# Patient Record
Sex: Female | Born: 1935 | Race: White | Hispanic: No
Health system: Southern US, Community
[De-identification: ages and names within clinical notes are randomized; demographics above are authoritative.]

---

## 2005-01-17 ENCOUNTER — Emergency Department: Payer: Self-pay | Admitting: Emergency Medicine

## 2005-01-19 ENCOUNTER — Other Ambulatory Visit: Payer: Self-pay

## 2005-01-19 ENCOUNTER — Inpatient Hospital Stay: Payer: Self-pay | Admitting: Internal Medicine

## 2005-02-18 ENCOUNTER — Ambulatory Visit: Payer: Self-pay | Admitting: Internal Medicine

## 2005-02-28 ENCOUNTER — Ambulatory Visit: Payer: Self-pay | Admitting: Internal Medicine

## 2005-03-08 ENCOUNTER — Ambulatory Visit: Payer: Self-pay | Admitting: Internal Medicine

## 2005-04-03 ENCOUNTER — Ambulatory Visit: Payer: Self-pay | Admitting: Ophthalmology

## 2005-04-09 ENCOUNTER — Ambulatory Visit: Payer: Self-pay | Admitting: Ophthalmology

## 2005-08-16 ENCOUNTER — Ambulatory Visit: Payer: Self-pay | Admitting: Gastroenterology

## 2006-03-03 ENCOUNTER — Ambulatory Visit: Payer: Self-pay | Admitting: Internal Medicine
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
## 2006-11-25 ENCOUNTER — Ambulatory Visit: Payer: Self-pay | Admitting: Internal Medicine

## 2007-03-10 ENCOUNTER — Ambulatory Visit: Payer: Self-pay | Admitting: Internal Medicine

## 2007-12-16 ENCOUNTER — Ambulatory Visit: Payer: Self-pay | Admitting: Internal Medicine

## 2008-03-11 ENCOUNTER — Ambulatory Visit: Payer: Self-pay | Admitting: Internal Medicine

## 2008-06-15 ENCOUNTER — Ambulatory Visit: Payer: Self-pay | Admitting: Internal Medicine

## 2009-01-31 ENCOUNTER — Emergency Department: Payer: Self-pay | Admitting: Emergency Medicine

## 2009-03-13 ENCOUNTER — Ambulatory Visit: Payer: Self-pay | Admitting: Internal Medicine

## 2009-04-20 ENCOUNTER — Emergency Department: Payer: Self-pay | Admitting: Emergency Medicine

## 2009-11-06 ENCOUNTER — Ambulatory Visit: Payer: Self-pay | Admitting: Neurology

## 2010-08-22 ENCOUNTER — Ambulatory Visit: Payer: Self-pay | Admitting: Internal Medicine

## 2010-11-07 ENCOUNTER — Ambulatory Visit: Payer: Self-pay | Admitting: Internal Medicine

## 2011-08-11 ENCOUNTER — Inpatient Hospital Stay: Payer: Self-pay | Admitting: Internal Medicine

## 2011-08-27 ENCOUNTER — Inpatient Hospital Stay: Payer: Self-pay | Admitting: Internal Medicine

## 2011-08-27 ENCOUNTER — Ambulatory Visit: Payer: Self-pay | Admitting: Internal Medicine

## 2011-08-27 LAB — COMPREHENSIVE METABOLIC PANEL
Albumin: 2.6 g/dL — ABNORMAL LOW (ref 3.4–5.0)
Alkaline Phosphatase: 261 U/L — ABNORMAL HIGH (ref 50–136)
Anion Gap: 7 (ref 7–16)
BUN: 12 mg/dL (ref 7–18)
Bilirubin,Total: 0.3 mg/dL (ref 0.2–1.0)
Calcium, Total: 8.6 mg/dL (ref 8.5–10.1)
Creatinine: 0.36 mg/dL — ABNORMAL LOW (ref 0.60–1.30)
EGFR (African American): >60
EGFR (Non-African Amer.): >60
Glucose: 183 mg/dL — ABNORMAL HIGH (ref 65–99)
Osmolality: 284 (ref 275–301)
Potassium: 4.5 mmol/L (ref 3.5–5.1)
SGPT (ALT): 43 U/L
Sodium: 140 mmol/L (ref 136–145)
Total Protein: 6.4 g/dL (ref 6.4–8.2)

## 2011-08-27 LAB — CBC
HCT: 42.3 % (ref 35.0–47.0)
HGB: 14.1 g/dL (ref 12.0–16.0)
MCH: 32.5 pg (ref 26.0–34.0)
MCV: 98 fL (ref 80–100)
Platelet: 311 10*3/uL (ref 150–440)
RDW: 14.1 % (ref 11.5–14.5)

## 2011-08-27 LAB — CK TOTAL AND CKMB (NOT AT ARMC)
CK, Total: 47 U/L (ref 21–215)
CK-MB: 2.5 ng/mL (ref 0.5–3.6)

## 2011-08-27 LAB — TROPONIN I: Troponin-I: <0.02 ng/mL

## 2011-08-28 LAB — BASIC METABOLIC PANEL
Anion Gap: 8 (ref 7–16)
Chloride: 101 mmol/L (ref 98–107)
Co2: 30 mmol/L (ref 21–32)
Creatinine: 0.39 mg/dL — ABNORMAL LOW (ref 0.60–1.30)
EGFR (African American): >60
Glucose: 105 mg/dL — ABNORMAL HIGH (ref 65–99)
Osmolality: 278 (ref 275–301)

## 2011-08-28 LAB — CBC WITH DIFFERENTIAL/PLATELET
Basophil #: 0 10*3/uL (ref 0.0–0.1)
Basophil %: 0.2 %
Eosinophil #: 0 10*3/uL (ref 0.0–0.7)
Eosinophil %: 0 %
HCT: 38.1 % (ref 35.0–47.0)
HGB: 12.6 g/dL (ref 12.0–16.0)
Lymphocyte %: 5.7 %
MCV: 98 fL (ref 80–100)
Monocyte #: 0.9 10*3/uL — ABNORMAL HIGH (ref 0.0–0.7)
Monocyte %: 5.2 %
Neutrophil %: 88.9 %
Platelet: 287 10*3/uL (ref 150–440)
RBC: 3.88 10*6/uL (ref 3.80–5.20)
RDW: 14.2 % (ref 11.5–14.5)
WBC: 16.5 10*3/uL — ABNORMAL HIGH (ref 3.6–11.0)

## 2011-08-29 LAB — BASIC METABOLIC PANEL
Anion Gap: 7 (ref 7–16)
BUN: 16 mg/dL (ref 7–18)
Calcium, Total: 8.6 mg/dL (ref 8.5–10.1)
Creatinine: 0.42 mg/dL — ABNORMAL LOW (ref 0.60–1.30)
EGFR (African American): >60
EGFR (Non-African Amer.): >60
Glucose: 134 mg/dL — ABNORMAL HIGH (ref 65–99)
Osmolality: 286 (ref 275–301)
Potassium: 4.2 mmol/L (ref 3.5–5.1)

## 2011-08-29 LAB — CBC WITH DIFFERENTIAL/PLATELET
Basophil #: 0 10*3/uL (ref 0.0–0.1)
Basophil %: 0.2 %
Eosinophil %: 0.1 %
HCT: 35.2 % (ref 35.0–47.0)
HGB: 11.6 g/dL — ABNORMAL LOW (ref 12.0–16.0)
Lymphocyte %: 6.5 %
MCH: 32 pg (ref 26.0–34.0)
Monocyte #: 0.3 10*3/uL (ref 0.0–0.7)
Monocyte %: 2.8 %
Neutrophil %: 90.4 %
Platelet: 323 10*3/uL (ref 150–440)
RBC: 3.63 10*6/uL — ABNORMAL LOW (ref 3.80–5.20)
WBC: 10.6 10*3/uL (ref 3.6–11.0)

## 2011-08-29 LAB — PROTIME-INR
INR: 2.3
Prothrombin Time: 24.7 secs — ABNORMAL HIGH (ref 11.5–14.7)

## 2011-08-30 LAB — CBC WITH DIFFERENTIAL/PLATELET
Eosinophil %: 0 %
Lymphocyte #: 0.6 10*3/uL — ABNORMAL LOW (ref 1.0–3.6)
MCH: 32.1 pg (ref 26.0–34.0)
MCHC: 32.7 g/dL (ref 32.0–36.0)
Monocyte #: 0.3 10*3/uL (ref 0.0–0.7)
Monocyte %: 2.7 %
Neutrophil #: 10.8 10*3/uL — ABNORMAL HIGH (ref 1.4–6.5)
Platelet: 363 10*3/uL (ref 150–440)

## 2011-08-30 LAB — BASIC METABOLIC PANEL
Anion Gap: 9 (ref 7–16)
Calcium, Total: 8.6 mg/dL (ref 8.5–10.1)
Co2: 33 mmol/L — ABNORMAL HIGH (ref 21–32)
Creatinine: 0.33 mg/dL — ABNORMAL LOW (ref 0.60–1.30)
EGFR (Non-African Amer.): >60
Glucose: 136 mg/dL — ABNORMAL HIGH (ref 65–99)
Osmolality: 294 (ref 275–301)
Potassium: 4.7 mmol/L (ref 3.5–5.1)
Sodium: 145 mmol/L (ref 136–145)

## 2011-08-30 LAB — PROTIME-INR: INR: 1.5

## 2011-08-31 LAB — BASIC METABOLIC PANEL
Anion Gap: 5 — ABNORMAL LOW (ref 7–16)
BUN: 19 mg/dL — ABNORMAL HIGH (ref 7–18)
Calcium, Total: 8.8 mg/dL (ref 8.5–10.1)
Co2: 36 mmol/L — ABNORMAL HIGH (ref 21–32)
Creatinine: 0.5 mg/dL — ABNORMAL LOW (ref 0.60–1.30)
EGFR (Non-African Amer.): >60
Potassium: 4.6 mmol/L (ref 3.5–5.1)

## 2011-08-31 LAB — CBC WITH DIFFERENTIAL/PLATELET
Basophil %: 0.6 %
Eosinophil #: 0 10*3/uL (ref 0.0–0.7)
Eosinophil %: 0.1 %
HCT: 37.8 % (ref 35.0–47.0)
Lymphocyte #: 0.9 10*3/uL — ABNORMAL LOW (ref 1.0–3.6)
Lymphocyte %: 9.9 %
Monocyte %: 8.4 %
Neutrophil #: 7.7 10*3/uL — ABNORMAL HIGH (ref 1.4–6.5)
Platelet: 382 10*3/uL (ref 150–440)
RBC: 3.83 10*6/uL (ref 3.80–5.20)
RDW: 14.6 % — ABNORMAL HIGH (ref 11.5–14.5)
WBC: 9.6 10*3/uL (ref 3.6–11.0)

## 2011-09-01 LAB — CULTURE, BLOOD (SINGLE)

## 2011-09-01 LAB — PROTIME-INR
INR: 1.4
Prothrombin Time: 17.8 secs — ABNORMAL HIGH (ref 11.5–14.7)

## 2011-09-02 LAB — BASIC METABOLIC PANEL
BUN: 14 mg/dL (ref 7–18)
Calcium, Total: 8.4 mg/dL — ABNORMAL LOW (ref 8.5–10.1)
EGFR (African American): >60
EGFR (Non-African Amer.): >60
Glucose: 76 mg/dL (ref 65–99)
Osmolality: 290 (ref 275–301)
Sodium: 146 mmol/L — ABNORMAL HIGH (ref 136–145)

## 2011-09-02 LAB — HEMOGLOBIN: HGB: 12.2 g/dL (ref 12.0–16.0)

## 2011-09-02 LAB — PROTIME-INR: INR: 1.7

## 2011-09-27 ENCOUNTER — Ambulatory Visit: Payer: Self-pay | Admitting: Internal Medicine

## 2011-09-27 DEATH — deceased

## 2013-02-12 IMAGING — CR DG CHEST 1V PORT
1 series · 1 of 1 positions shown · non-contrast
Comparison: none

REASON FOR EXAM: Shortness of Breath
COMMENTS:

PROCEDURE:     DXR - DXR PORTABLE CHEST SINGLE VIEW  - August 27, 2011 [DATE]
RESULT:     Comparison: 08/12/2011

[ap]
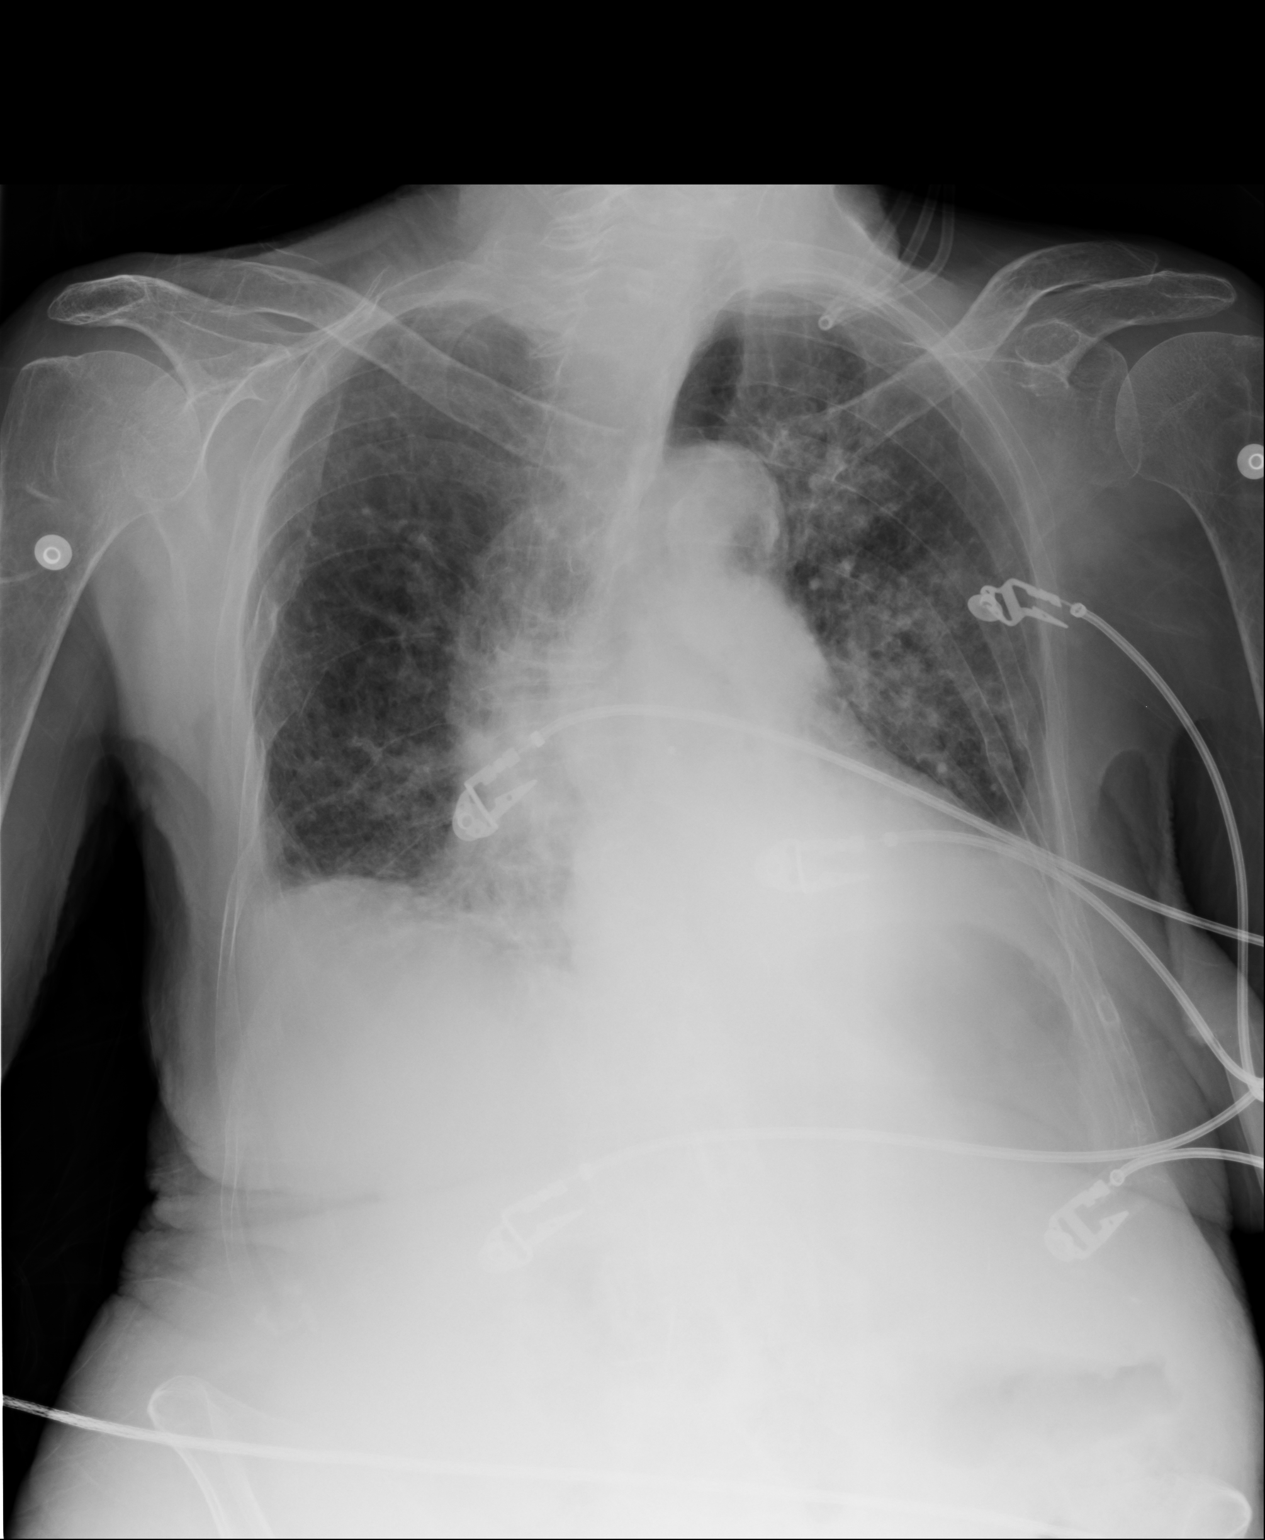

[1 of 1 positions shown; findings below may reference images not displayed]

FINDINGS: The patient is rotated to the left. Heart and mediastinum are stable given
patient rotation and the low lung volumes. There are interstitial and
heterogeneous opacities, left greater than right, which are increased from
prior. There are old right lateral rib fractures.
IMPRESSION: Increased heterogeneous and interstitial pulmonary opacities, left greater
than right, which may represent pulmonary edema or infection. Followup PA
and lateral chest radiographs are recommended.

## 2013-02-13 IMAGING — CR DG CHEST 2V
1 series · 2 of 2 positions shown · non-contrast
Comparison: none

REASON FOR EXAM: SOB
COMMENTS:

[Series 1: x chest ap · 0.14mm/px · 2 of 2 slices shown]
[im 1/2]
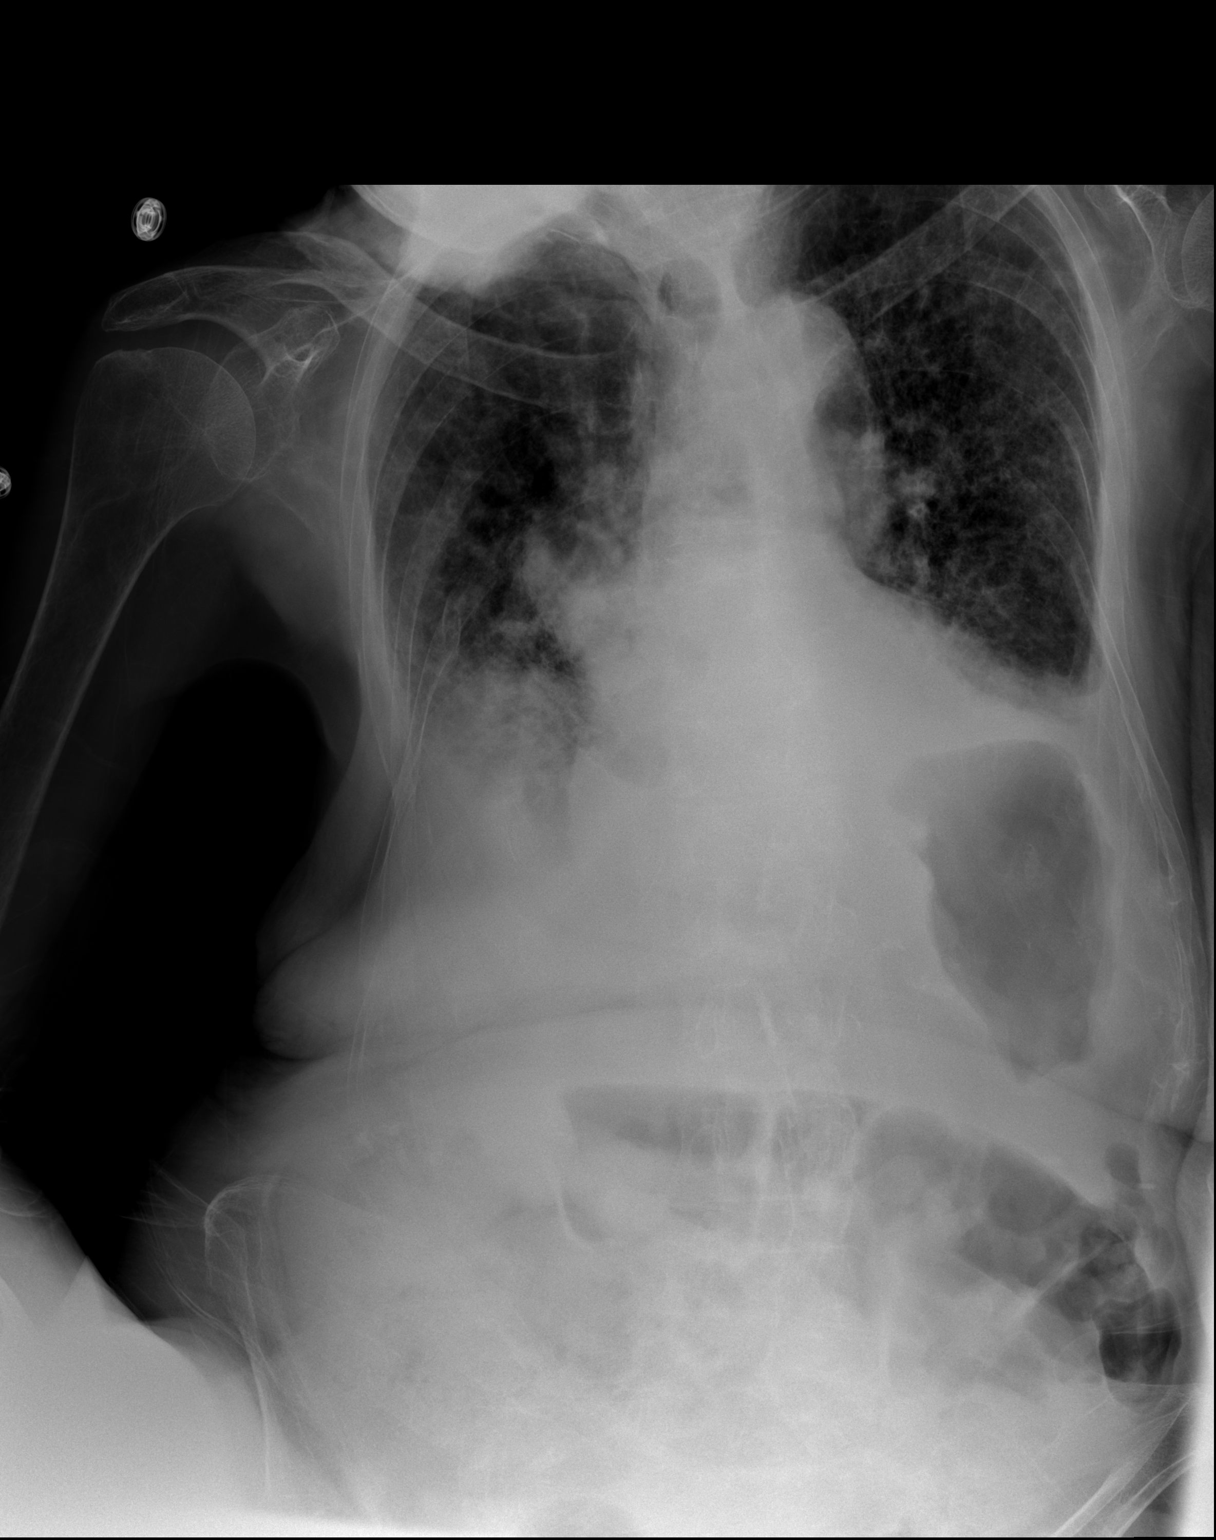
[im 2/2]
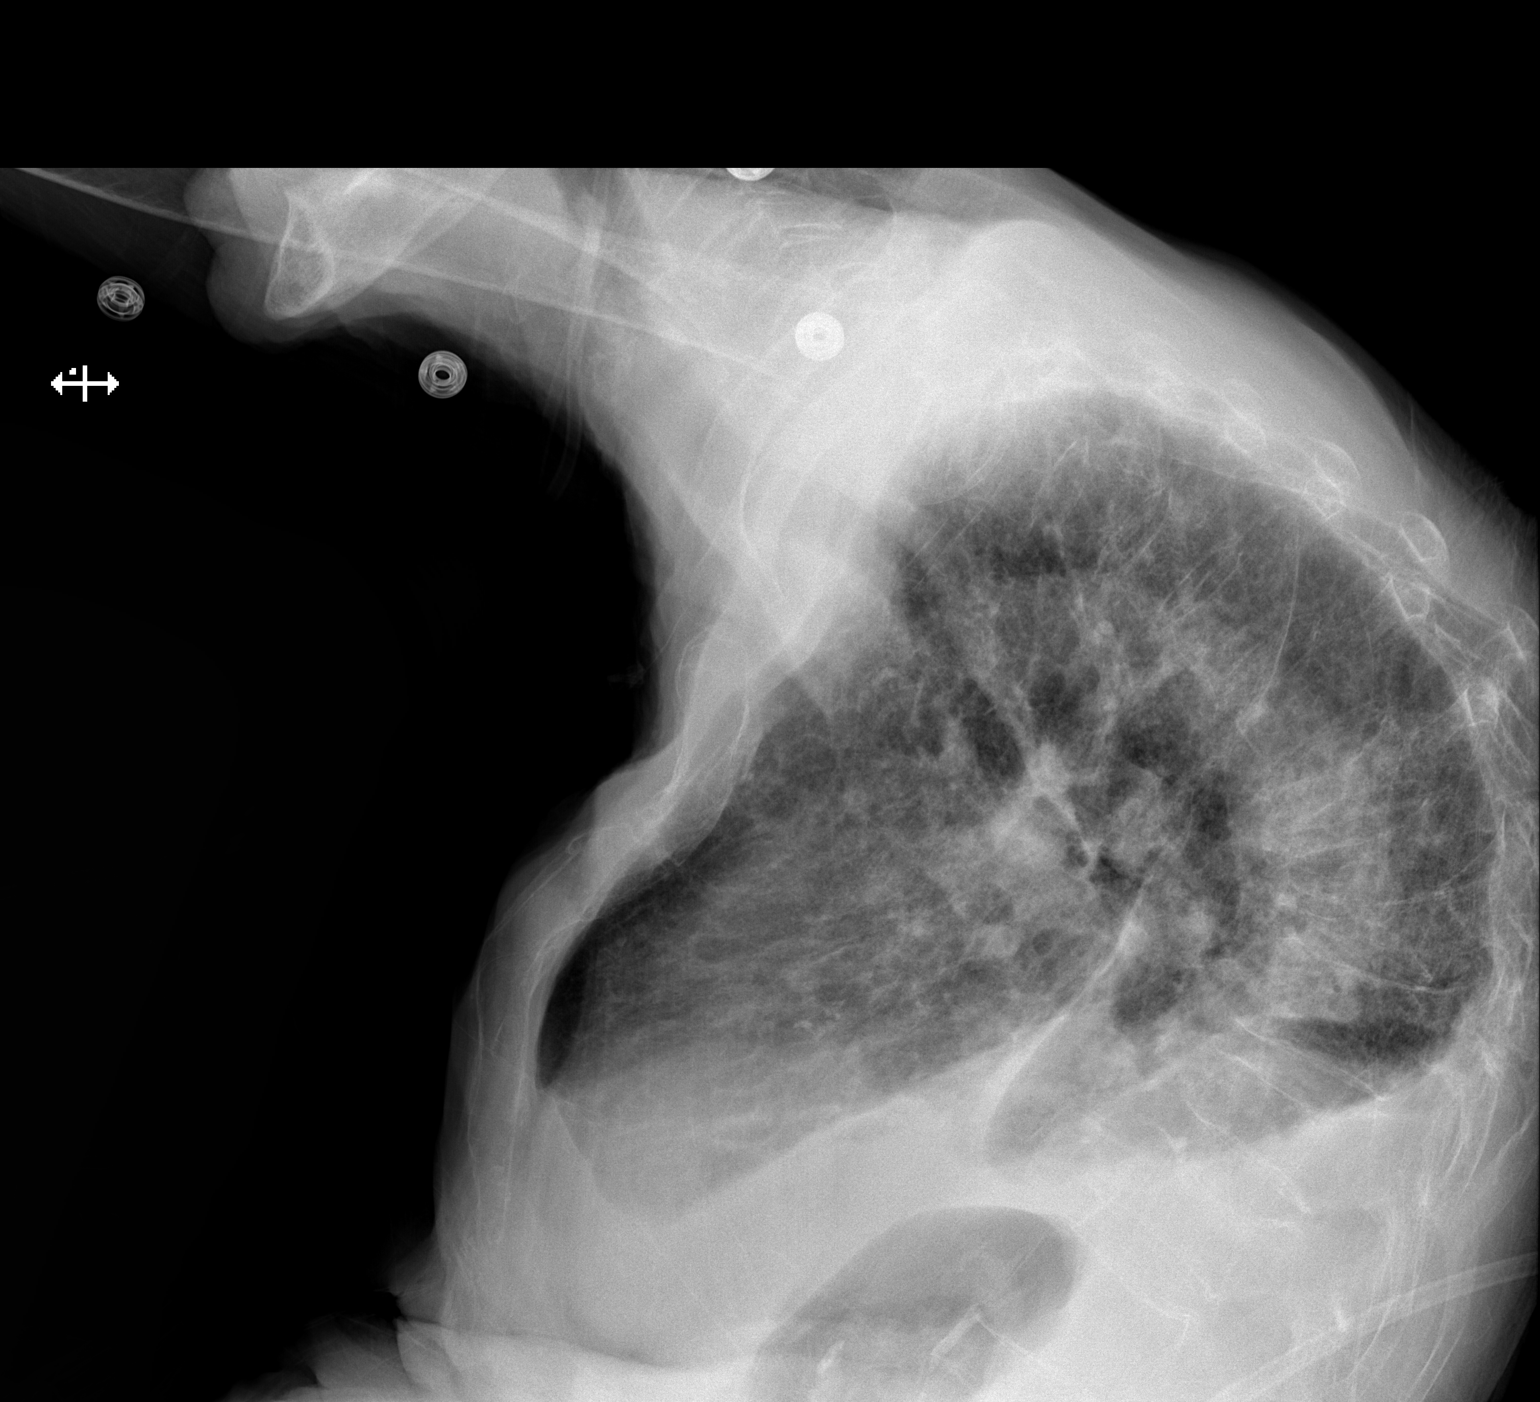

[2 of 2 positions shown; findings below may reference images not displayed]

PROCEDURE:     DXR - DXR CHEST PA (OR AP) AND LATERAL  - August 28, 2011 [DATE]

RESULT:     Comparison is made to the prior exam of 08/27/2011. There is again
noted prominence of the pulmonary vascularity and diffuse thickening of the
interstitial lung markings compatible with interstitial edema. There is
blunting of the left costophrenic angle compatible with a small left
effusion. The heart appears mildly enlarged. The general appearance of the
chest suggests CHF superimposed on COPD. In the lateral view, multiple
thoracic vertebral compression deformities are noted and appear old.
IMPRESSION: 1. There are observed changes consistent with CHF superimposed on COPD.
2. There is a small left pleural effusion.
3. Multiple thoracic vertebral compression deformities are noted and appear
likely to be old.
4. There are also noted multiple old-appearing rib fracture deformities and
there is deformity of the sternum compatible with prior sternal fracture.

## 2014-12-18 NOTE — H&P (Signed)
PATIENT NAME:  Michele Daniels, Michele Daniels MR#:  [id number] DATE OF BIRTH:  [birth year]  DATE OF ADMISSION:  08/27/2011  PRIMARY CARE PHYSICIAN: Dr. Aletha Halim  CHIEF COMPLAINT: Shortness of breath and hypoxia.   HISTORY OF PRESENT ILLNESS: This is a 81 year old female who currently resides at [facility] nursing facility was sent over to the ER because of low oxygen sats with sats in the 80s and some cough and shortness of breath. Patient herself is a poor historian, therefore most of the history obtained from the daughter at bedside. Patient says that she was just in the hospital with a pelvic fracture about 10 days ago, was discharged to a skilled nursing facility. She was also noted to have a T12 compression fracture for which she was going to get evaluated as outpatient by Dr. Gerrit Heck. She has been having a lot of back and chronic pain which is not being alleviated. For the past two days she has had a cough and worsening shortness of breath and was noted to be hypoxic and sent over to the ER for further evaluation. She denies any chest pains. She denies any palpitations, syncope, any nausea, vomiting, abdominal pain, any other associated symptoms presently. When brought to the Emergency Room, patient was noted to have a suspected pneumonia and hospitalist service was contacted for further treatment and evaluation.    REVIEW OF SYSTEMS: CONSTITUTIONAL: Positive documented fever of 100.4. No weight gain, no weight loss. EYES: No blurry or double vision. ENT: No tinnitus. No postnasal drip. No redness of the oropharynx. RESPIRATORY: Positive cough. No wheeze, no hemoptysis. Positive dyspnea. CARDIOVASCULAR: No chest pain, no orthopnea, no palpitations, no syncope. GASTROINTESTINAL: No nausea, no vomiting, no diarrhea, no abdominal pain, no melena, no hematochezia. GENITOURINARY: No dysuria, no hematuria. ENDOCRINE: No polyuria or nocturia. No heat or cold intolerance. HEME: No anemia. No bruising. No  bleeding. INTEGUMENTARY: No rashes. No lesions. MUSCULOSKELETAL: No arthritis, no swelling, no gout. NEUROLOGIC: No numbness, no tingling, no ataxia, no seizure-type activity. PSYCH: No anxiety, no insomnia, no ADD.   PAST MEDICAL HISTORY:  1. Hypertension.  2. History of seizure disorder. 3. History of recent pulmonary embolism.  4. Pelvic fracture. 5. Osteoporosis. 6. Chronic obstructive pulmonary disease, oxygen dependent.  7. Failure to thrive. 8. T12 compression fracture.   ALLERGIES: No known drug allergies.   SOCIAL HISTORY: Was a long-time smoker for about 30 to 40 years. No alcohol abuse. No illicit drug abuse. Currently resides at a skilled nursing facility.   FAMILY HISTORY: Mother died from lung cancer. Father died from a stroke.   CURRENT MEDICATIONS:  1. Colace 100 mg b.i.d.  2. Milk of magnesia 30 mL q.12 hours p.r.n. constipation. 3. Duragesic patch 50 mcg every three days.  4. Oxycodone 10 mg every four hours p.r.n. pain. 5. Lopressor 25 mg b.i.d.  6. Zofran 4 mg every four hours p.r.n. nausea, vomiting. 7. Valium 5 mg q.8 hours p.r.n. anxiety.  8. Keppra 500 mg b.i.d.  9. Advair 250/50, 1 puff b.i.d.  10. Combivent 2 puffs q.i.d.  11. Dilantin 100 mg b.i.d.  12. Dulcolax suppository b.i.d. p.r.n. constipation. 13. Coumadin 7.5 mg daily.   PHYSICAL EXAMINATION:  VITAL SIGNS: Patient's vital signs are noted to be: Temperature 99.6, pulse 89, respirations 24, blood pressure 123/82, sats 93% on 2 liters nasal cannula.   GENERAL: She is a pleasant but lethargic-appearing female in no apparent distress.   HEENT: She is atraumatic, normocephalic. Her extraocular muscles are intact. The  pupils are equal, reactive to light. Her sclerae are anicteric. No conjunctival injection. No oropharyngeal erythema.   NECK: Supple. There is no jugular venous distention, no bruits, no lymphadenopathy, no thyromegaly.   HEART: Regular rate and rhythm. No murmurs, no rubs, no  clicks.   LUNGS: She has poor inspiratory and expiratory effort. Diffuse rhonchi. Negative use of accessory muscles. No dullness to percussion.   ABDOMEN: Soft, flat, nontender, nondistended. Has good bowel sounds. No hepatosplenomegaly appreciated.   EXTREMITIES: No evidence of any cyanosis, clubbing, or peripheral edema. Has +2 pedal and radial pulses bilaterally.   NEUROLOGIC: She is alert, awake, oriented x3 with no focal motor or sensory deficits appreciated bilaterally.   SKIN: Moist, warm. She has multiple actinic keratosis throughout her body.   LYMPHATIC: There is no cervical or axillary lymphadenopathy.   LABORATORY, DIAGNOSTIC, AND RADIOLOGICAL DATA: Serum glucose 183, BUN 12, creatinine 0.3, sodium 140, potassium 4.5, chloride 102, bicarbonate 31. LFTs are within normal limits. Troponin less than 0.02. White cell count 15.9, hemoglobin 14.1, hematocrit 42.3, platelet count 311. She did have a chest x-ray done which showed increased heterogeneous and interstitial pulmonary opacities left greater than the right which may represent pulmonary edema or infection.   ASSESSMENT AND PLAN: This is a 81 year old female with past medical history of hypertension, seizure disorder, oxygen dependent chronic obstructive pulmonary disease, history of pulmonary embolism, T12 compression fracture, pelvic fracture, failure to thrive came to the hospital due to fever, cough, and shortness of breath.  1. Suspected pneumonia. This is the likely source of her cough, shortness of breath, fever, and hypoxemia. Patient has received IV Rocephin and Zithromax in the Emergency Room which I will continue. Follow her sputum and her blood cultures and follow her clinically.  2. Chronic obstructive pulmonary disease. No evidence of acute exacerbation. Continue with Advair, Combivent and p.r.n. nebulizer treatments.  3. Leukocytosis likely secondary to pneumonia. Will improve once we treat her with antibiotics. Will  follow her white cell count.  4. Chronic back pain. This seems to be a very acute issue for the patient because she has been complaining of back pain since she left the hospital about 10 days ago. She does have a T12 compression fracture. For now I will continue pain control with oxycodone and Duragesic patch. I will get an orthopedic consult with Dr. Gerrit Heckaliff as she is supposed to see him as an outpatient within the next week. Will also continue physical therapy.  5. Hypertension. Continue metoprolol.  6. History of pulmonary embolism. Will hold her Coumadin for now as she possibly might need kyphoplasty. Will follow her coags.  7. History of seizure disorder. Continue Keppra and Dilantin. No acute seizures.  8. CODE STATUS: Patient is a DO NOT INTUBATE, DO NOT RESUSCITATE.  9. Patient will be transferred to Dr. Judithann SheenSparks service.   TIME SPENT: 50 minutes.   ____________________________ Rolly PancakeVivek J. Cherlynn KaiserSainani, MD vjs:cms D: 08/27/2011 12:55:30 ET T: 08/28/2011 07:04:50 ET JOB#: [id number]  cc: Rolly PancakeVivek J. Cherlynn KaiserSainani, MD, <Dictator> Duane LopeJeffrey D. Judithann SheenSparks, MD Houston SirenVIVEK J SAINANI MD ELECTRONICALLY SIGNED 08/30/2011 21:33

## 2014-12-18 NOTE — Consult Note (Signed)
Brief Consult Note: Diagnosis: Back pain, possible compression fracture, old pelvic fracture.   Patient was seen by consultant.   Comments: I do not feel she is a candidate for any intervention due to medical issues, coumadin use and history of of PE. Recommend no further orthopedic imaging. Needs Physical Therapy for ambulation F/U prn.  Electronic Signatures: Celesta Gentilealiff, Fern Canova C (MD)  (Signed 03-Jan-13 11:32)  Authored: Brief Consult Note   Last Updated: 03-Jan-13 11:32 by Celesta Gentilealiff, Charan Prieto C (MD)

## 2014-12-18 NOTE — Discharge Summary (Signed)
PATIENT NAME:  Michele Daniels, Michele Daniels MR#:  [id number] DATE OF BIRTH:  1931-08-01  DATE OF ADMISSION:  08/27/2011 DATE OF DISCHARGE:  09/02/2011  TYPE OF DISCHARGE: Patient transferred to the hospice home.   REASON FOR ADMISSION: Shortness of breath with hypoxia.   HISTORY OF PRESENT ILLNESS: Please see the dictated history of present illness done by Dr. Cherlynn KaiserSainani on 08/27/2011.   PAST MEDICAL HISTORY:  1. Benign hypertension.  2. Seizure disorder.  3. History of pulmonary embolism.  4. Pelvic fracture.  5. Osteoporosis.  6. Oxygen-dependent chronic obstructive pulmonary disease.  7. T12 compression fracture.  8. Failure to thrive.   MEDICATIONS ON ADMISSION: Please see admission note.   ALLERGIES: No known drug allergies.   SOCIAL HISTORY, FEMALE AND REVIEW OF SYSTEMS: As per admission note.   PHYSICAL EXAM: GENERAL: The patient is lethargic, in no acute distress. VITAL SIGNS: Vital signs remarkable for blood pressure of 123/82 with a heart rate of 89 and a respiratory rate of 24. Temperature is 99.6. Sat was 93% on 2 liters. HEENT: Exam was unremarkable. NECK: Supple without jugular venous distention. LUNGS: Diffuse rhonchi. CARDIAC: Exam revealed a regular rate and rhythm. Normal S1 and S2. ABDOMEN: Soft and nontender. EXTREMITIES: Without edema. NEUROLOGIC: Exam was grossly nonfocal.   HOSPITAL COURSE: The patient was admitted with acute on chronic respiratory failure with pneumonia and chronic obstructive pulmonary disease exacerbation. She also had significant pain in her back from her T12 compression fracture. She was admitted to the floor as a NO CODE BLUE, DO NOT RESUSCITATE. She was started on IV antibiotics with IV steroids, oxygen, and SVNs. She was seen in consultation by orthopedics who deferred kyphoplasty for her compression fracture. She was treated with pain medications for her chronic pain. Her respiratory status continued to deteriorate. She was not eating. She remained  lethargic. She remained oxygen dependent. Follow up chest x-ray showed no improvement of her underlying lung disease. Prognosis was poor. Palliative care consult was obtained. The family requested transfer to the hospice home for terminal care. She was subsequently transferred to the hospice home on 09/02/2011 for further care and comfort.   DISCHARGE DIAGNOSES:  1. Acute on chronic respiratory failure.  2. Chronic obstructive pulmonary disease exacerbation.  3. Pneumonia.  4. T12 compression fracture with back pain. 5. Seizure disorder.   ____________________________ Duane LopeJeffrey D. Judithann SheenSparks, MD jds:cms D: 09/14/2011 02:08:23 ET T: 09/14/2011 13:16:11 ET JOB#: [id number]  cc: Duane LopeJeffrey D. Judithann SheenSparks, MD, <Dictator> Daniel Ritthaler Rodena Medin Alver Leete MD ELECTRONICALLY SIGNED 09/15/2011 0:01

## 2014-12-18 NOTE — Consult Note (Signed)
PATIENT NAME:  Michele Daniels, Michele Daniels MR#:  [id number] DATE OF BIRTH:  11-20-1930  DATE OF CONSULTATION:  08/14/2011  REFERRING PHYSICIAN:  Aram BeechamJeffrey Sparks, MD  CONSULTING PHYSICIAN:  Winn JockJames C. Kevon Tench, MD  REASON FOR CONSULTATION: 81 year old female with back pain and pelvic pain.   HISTORY OF PRESENT ILLNESS: The patient was admitted with significant back pain on 08/11/2011 after suffering a fall. She was initially diagnosed with left pelvic fracture on 12/10 with x-rays. She has been mostly bedbound. Uses a walker to get around her kitchen to fix her meals. Has home therapy. She fell down on her cough and had marked back pain. She was admitted for pain control and optimization of her INR. She has a history of prior back pain.   Detailed review of systems and past medical history difficult to obtain. Obtained from the chart are the social history, allergies, and family history as per admission history and physical note.  MEDICATIONS: As per SCM.   REVIEW OF SYSTEMS: Cannot be obtained.    PHYSICAL EXAMINATION:   GENERAL: The patient is alert, oriented to place.   VITALS: Blood pressure 160/80, pulse 88 and regular, respirations 20.   HEENT: Pupils equal, round, and reactive to light and accommodation. Extraocular movements intact.    NECK: Supple without bruits.   CHEST: Clear and atraumatic around the ribs.   CARDIAC: Normal.   CERVICAL SPINE: Good range of motion with no significant discomfort.   THORACIC SPINE: Kyphosis with tenderness at the thoracolumbar junction. Rather marked tenderness in the area of T12.   LUMBOSACRAL SPINE: Mildly tender.   SHOULDERS, ELBOWS, WRISTS, AND HANDS: Good range of motion with some mild degenerative changes. Normal neurovascular examination.   HIPS, KNEES, FEET, AND ANKLES: Overall have good range of motion. Mild degenerative change. There is some pain around the pelvis. She is not asked to ambulate.   DIAGNOSTICS: Diagnostic studies are reviewed.  MRI of the lumbar spine dated 12/18 reveals an acute compression fracture of T12 with approximately 25% vertebral body height loss. There are some degenerative changes. Chronic compression fractures at T11 and L4. Small foci of signal change are noted in T11, L3, and L4 most likely benign.   KUB obtained today revealed mild distention of the cecum. No bowel obstruction.   Chest x-ray from 12/17 revealed mild interstitial thickening. CT scan of the chest and lumbar spine are reviewed and show pulmonary emboli changes on the right with some slight atelectasis.   Prior x-rays are reviewed and revealed the pelvic fracture.   CLINICAL IMPRESSION:  1. Pelvic fracture, mild to moderate pain. Thoracic compression fractures seem to be causing a significant amount of pain. I think she would increase her ambulatory capacity if we went ahead with kyphoplasty. This is discussed with Dr. Judithann SheenSparks and with the patient. We will set this up for tomorrow if INR is better. It was 2.0 today. Will need to stop the heparin drip a couple of hours before the procedure. Information was left in the room.  2. Underlying medical issues are as per Dr. Judithann SheenSparks' note.  3. Risks and benefits are discussed with the patient.   ____________________________ Winn JockJames C. Gerrit Heckaliff, MD jcc:drc D: 08/14/2011 18:49:49 ET T: 08/15/2011 08:26:20 ET JOB#: [id number]  cc: Winn JockJames C. Gerrit Heckaliff, MD, <Dictator> Winn JockJAMES C Carmellia Kreisler MD ELECTRONICALLY SIGNED 09/12/2011 16:03
